# Patient Record
Sex: Male | Born: 1980 | Race: Black or African American | Hispanic: No | Marital: Single | Smoking: Never smoker
Health system: Southern US, Community
[De-identification: ages and names within clinical notes are randomized; demographics above are authoritative.]

## PROBLEM LIST (undated history)

## (undated) DIAGNOSIS — K219 Gastro-esophageal reflux disease without esophagitis: Secondary | ICD-10-CM

## (undated) HISTORY — DX: Gastro-esophageal reflux disease without esophagitis: K21.9

---

## 2004-01-14 ENCOUNTER — Emergency Department: Payer: Self-pay | Admitting: Emergency Medicine

## 2004-01-20 ENCOUNTER — Emergency Department: Payer: Self-pay | Admitting: Emergency Medicine

## 2004-05-06 ENCOUNTER — Emergency Department: Payer: Self-pay | Admitting: Unknown Physician Specialty

## 2004-05-12 ENCOUNTER — Emergency Department: Payer: Self-pay | Admitting: Unknown Physician Specialty

## 2004-05-14 ENCOUNTER — Emergency Department: Payer: Self-pay | Admitting: Emergency Medicine

## 2005-08-26 ENCOUNTER — Emergency Department: Payer: Self-pay | Admitting: Emergency Medicine

## 2005-09-09 ENCOUNTER — Emergency Department: Payer: Self-pay | Admitting: Emergency Medicine

## 2005-11-20 ENCOUNTER — Emergency Department: Payer: Self-pay | Admitting: Emergency Medicine

## 2006-04-02 ENCOUNTER — Emergency Department: Payer: Self-pay | Admitting: Emergency Medicine

## 2006-05-31 ENCOUNTER — Other Ambulatory Visit: Payer: Self-pay

## 2006-05-31 ENCOUNTER — Inpatient Hospital Stay: Payer: Self-pay | Admitting: Internal Medicine

## 2007-07-20 IMAGING — CR RIGHT TIBIA AND FIBULA - 2 VIEW
1 series · 2 of 2 positions shown · non-contrast
Comparison: none

REASON FOR EXAM: INJURY, PAIN
COMMENTS:

RESULT:     AP and lateral views of the RIGHT lower leg show no fracture,
dislocation or other acute bony abnormality.

[Series 1: view not recorded · 0.17mm/px · 2 of 2 slices shown]
[im 1/2]
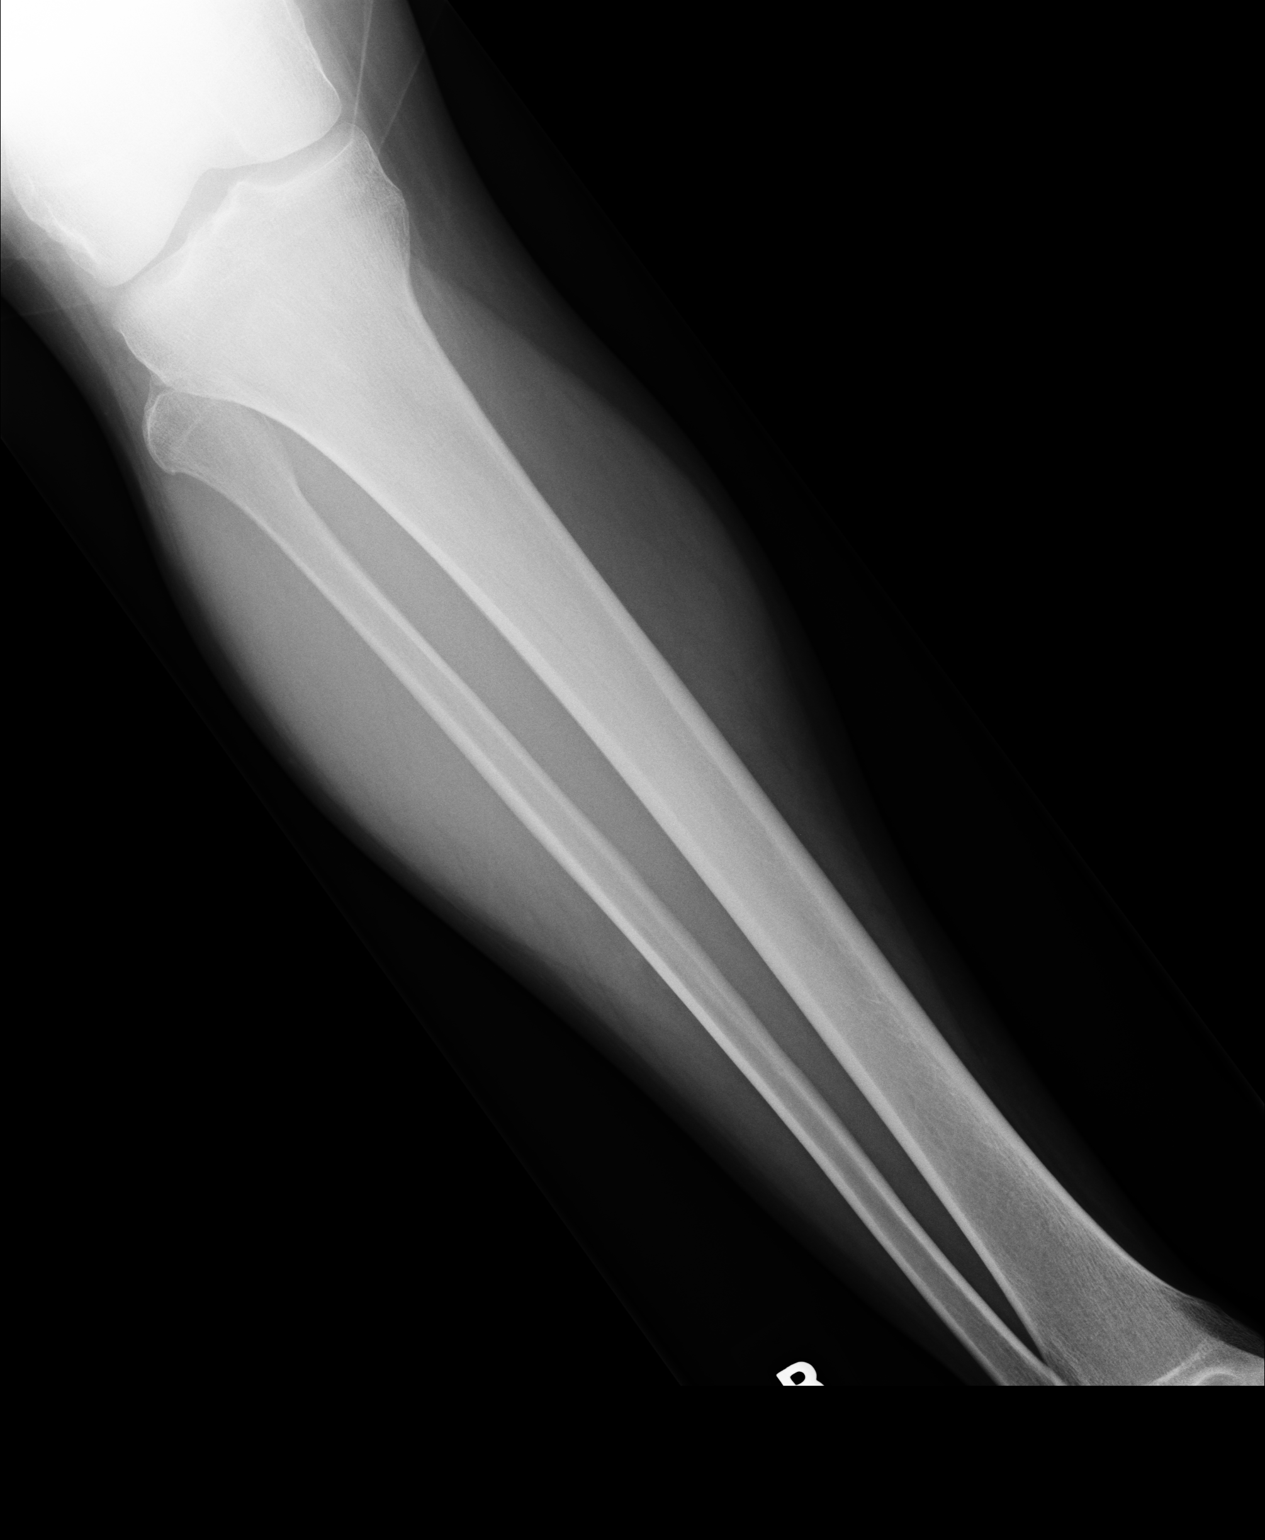
[im 2/2]
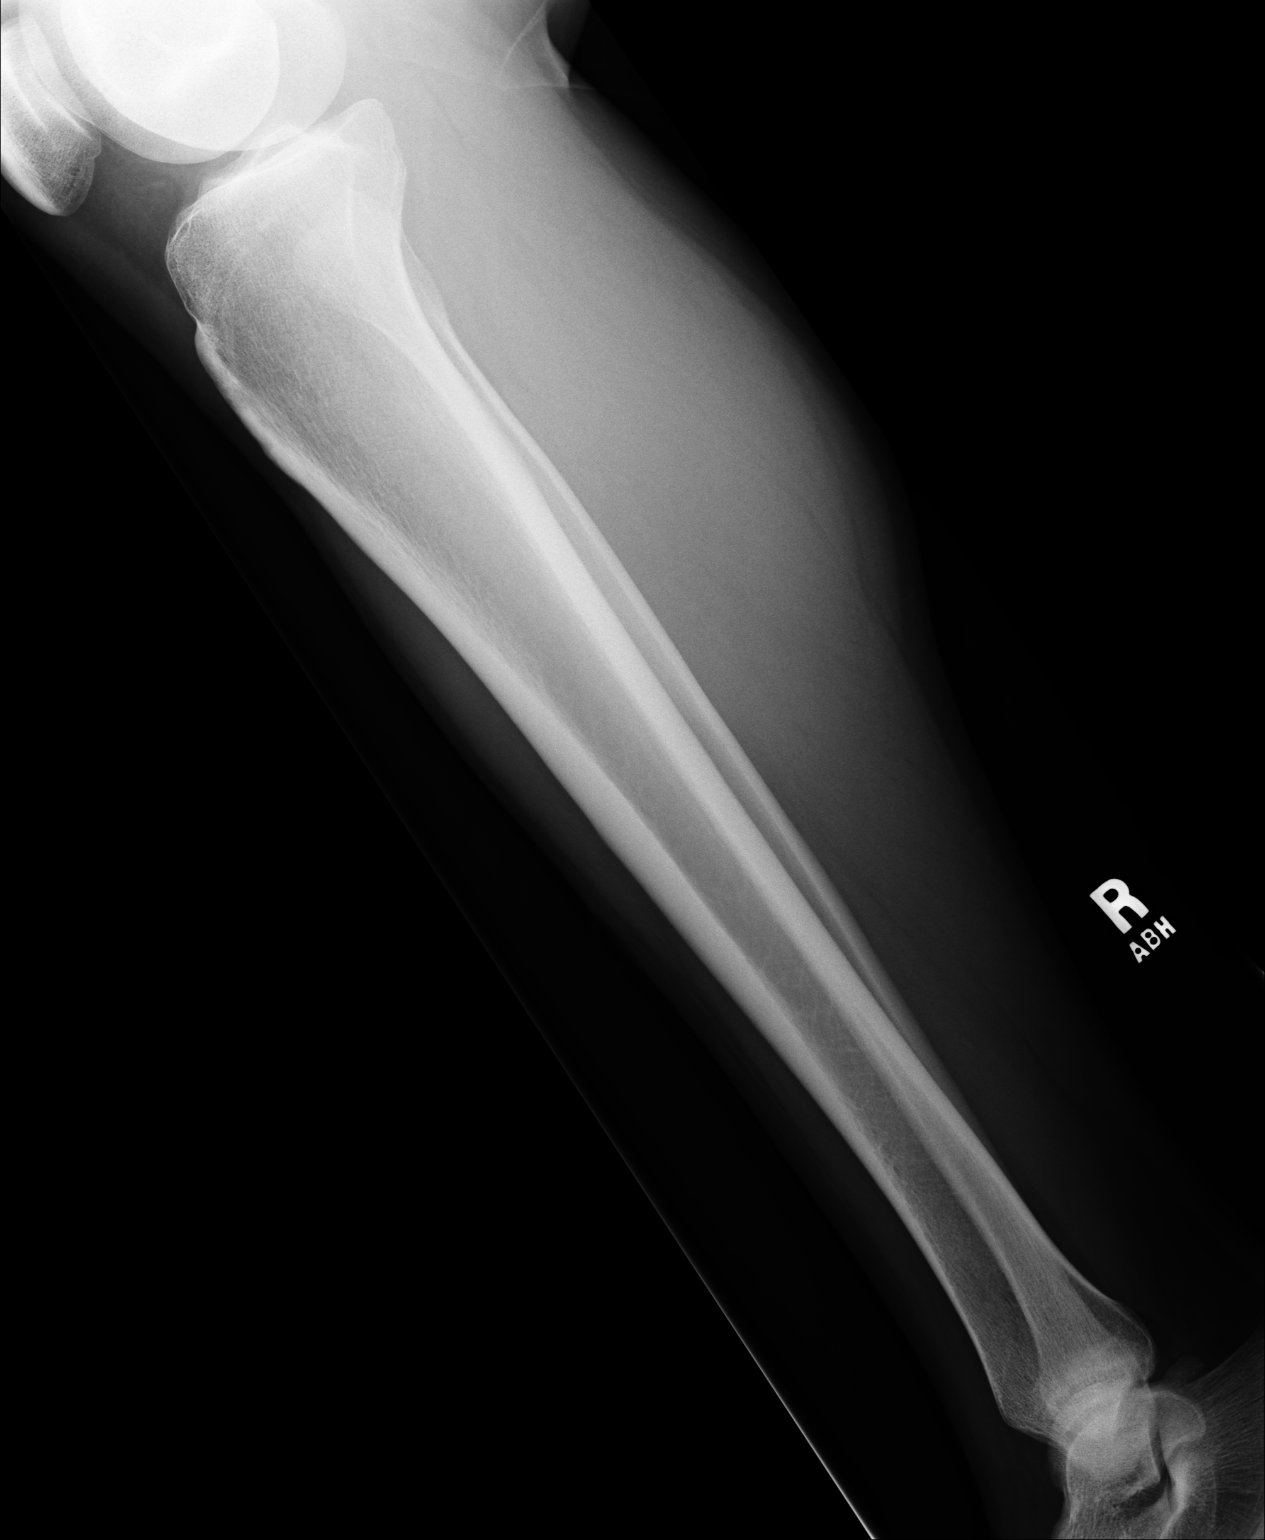

[2 of 2 positions shown; findings below may reference images not displayed]

IMPRESSION: No acute changes are identified.

## 2007-10-07 ENCOUNTER — Emergency Department: Payer: Self-pay | Admitting: Emergency Medicine

## 2007-12-02 ENCOUNTER — Emergency Department: Payer: Self-pay | Admitting: Emergency Medicine

## 2007-12-26 ENCOUNTER — Emergency Department (HOSPITAL_COMMUNITY): Admission: EM | Admit: 2007-12-26 | Discharge: 2007-12-26 | Payer: Self-pay | Admitting: Emergency Medicine

## 2009-08-31 IMAGING — CT CT HEAD WITHOUT CONTRAST
2 series · 15 of 30 positions shown, 19 images · non-contrast
Comparison: none

REASON FOR EXAM: headache
COMMENTS:

[Series 2: without · axial · non-contrast · 0.43mm/px · z∈[-144,-14]mm · 13 of 32 slices shown, 17 images]
[im 3/32  brain]
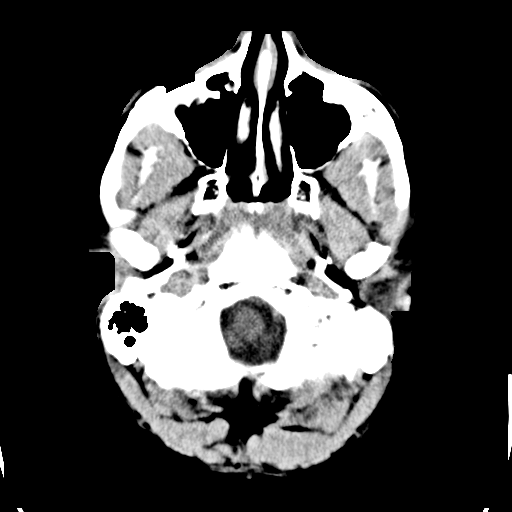
[im 3/32  bone]
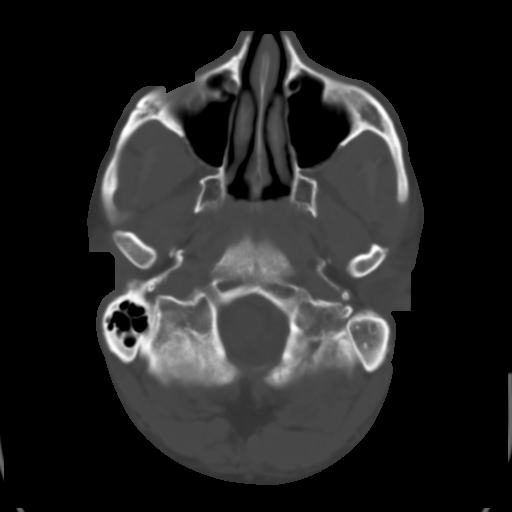
[im 5/32  brain]
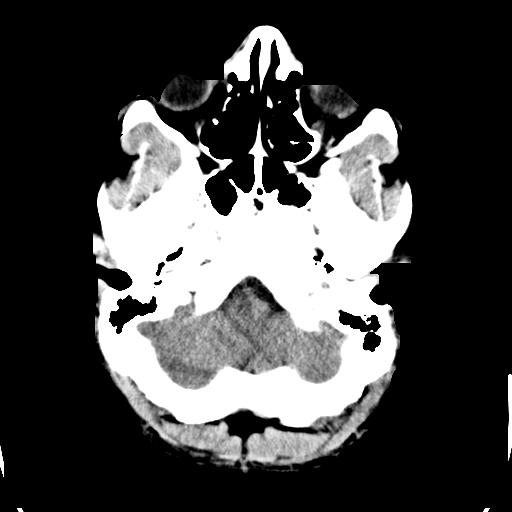
[im 7/32  brain]
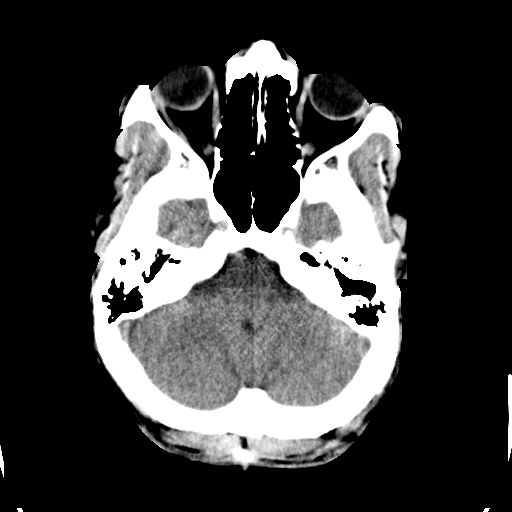
[im 9/32  brain]
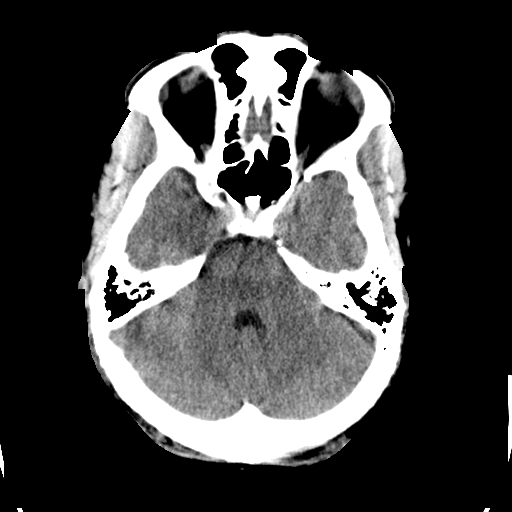
[im 12/32  brain]
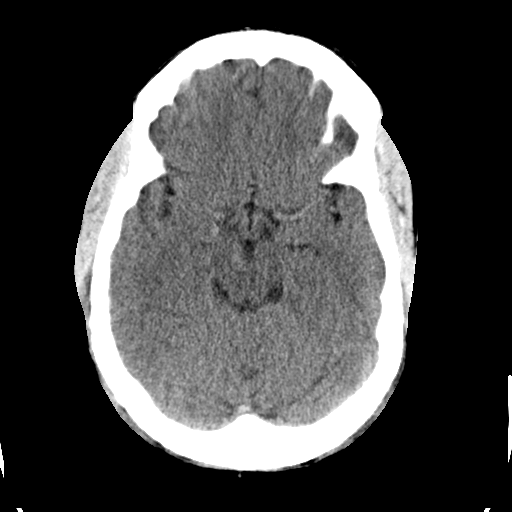
[im 12/32  bone]
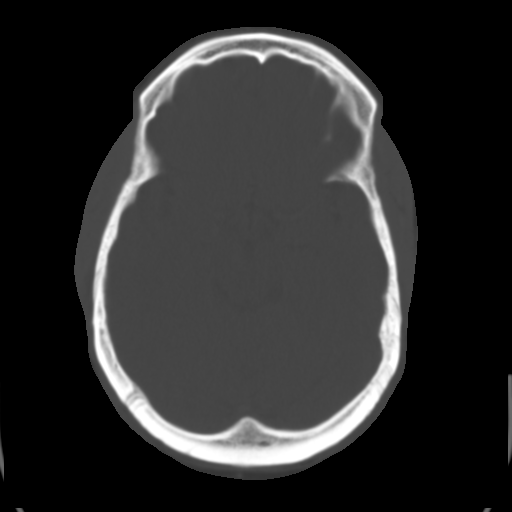
[im 14/32  brain]
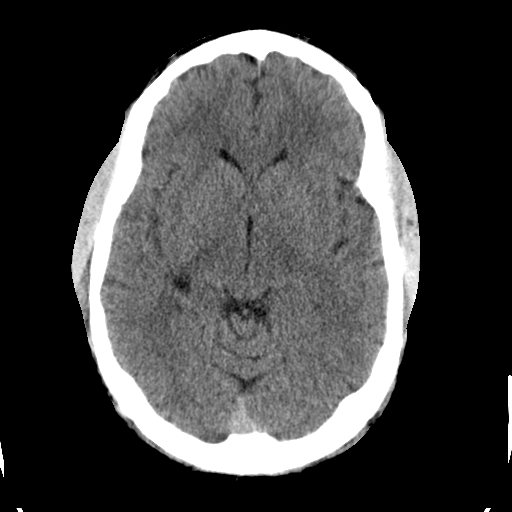
[im 16/32  brain]
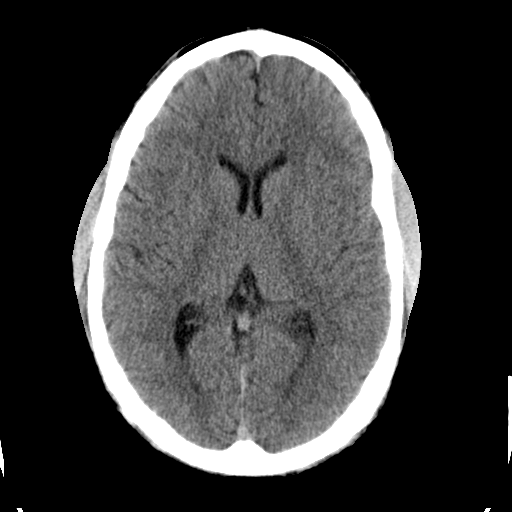
[im 18/32  brain]
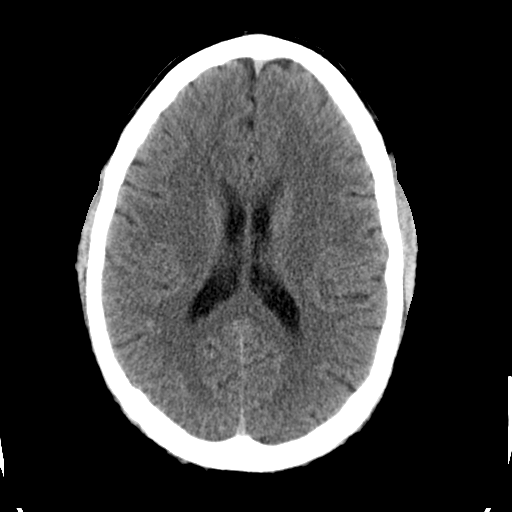
[im 20/32  brain]
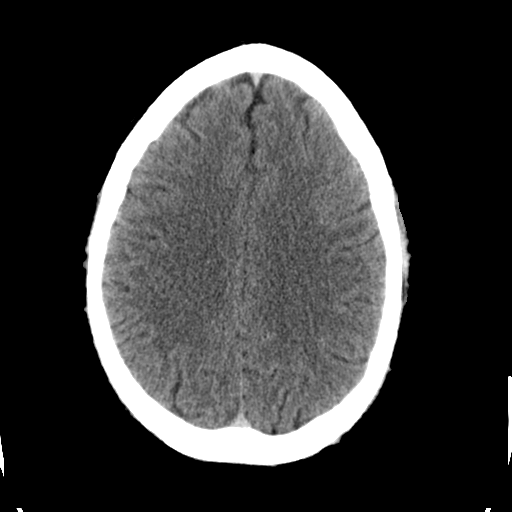
[im 20/32  bone]
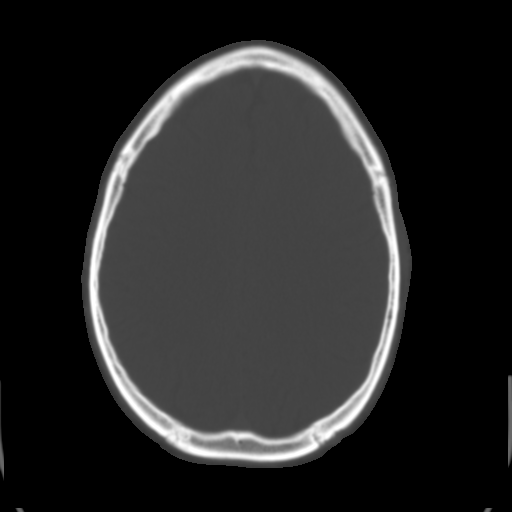
[im 23/32  brain]
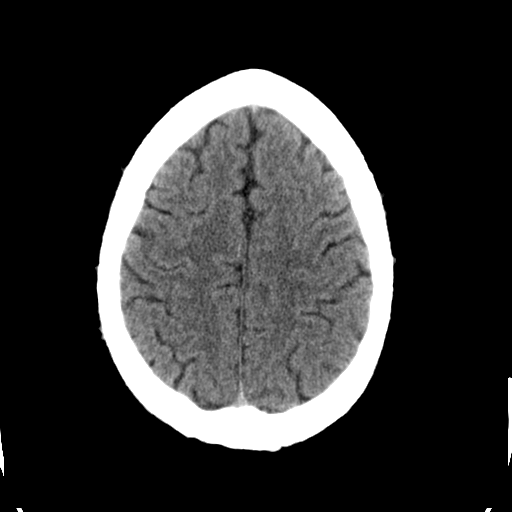
[im 25/32  brain]
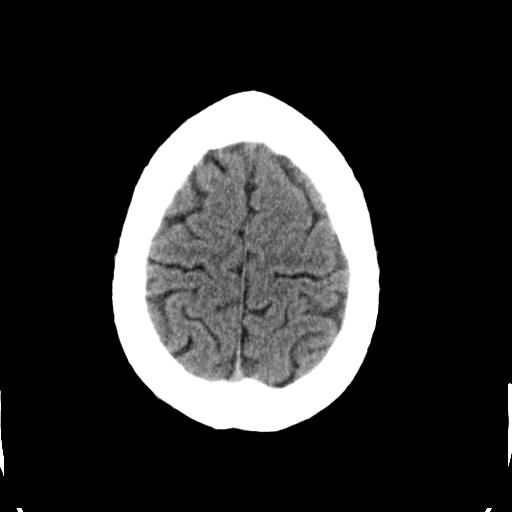
[im 27/32  brain]
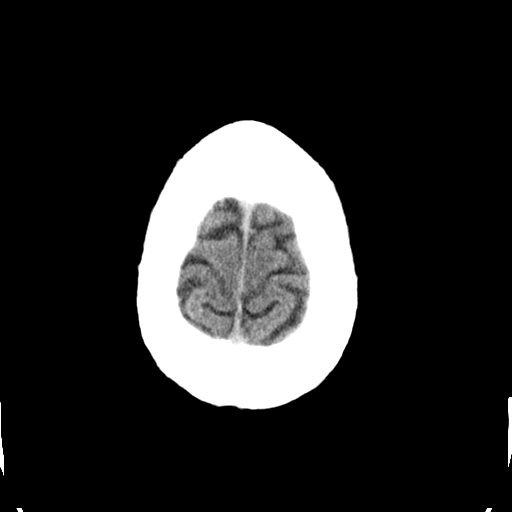
[im 29/32  brain]
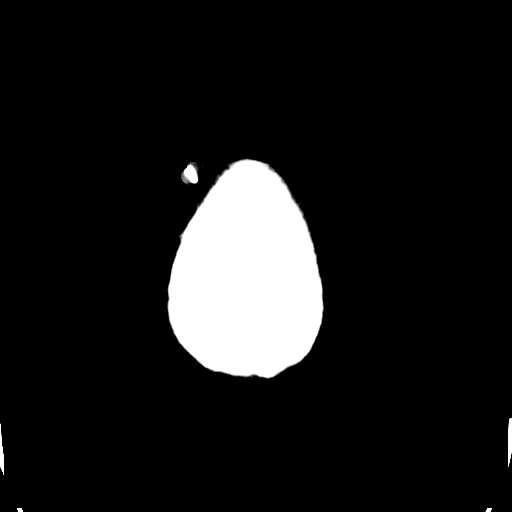
[im 29/32  bone]
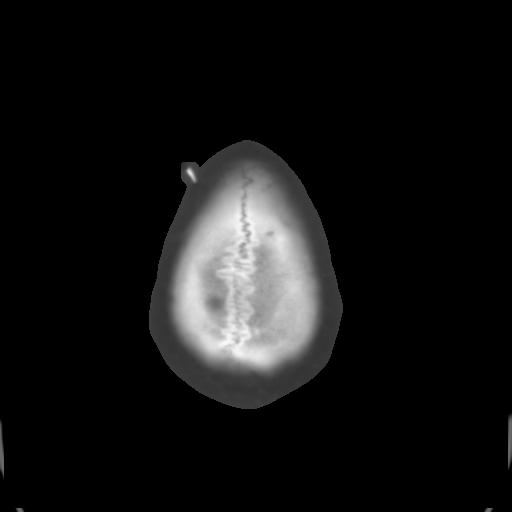

[Series 3: bone · axial · 0.43mm/px · z∈[-144,-124]mm · 2 of 32 slices shown]
[im 3/32  bone]
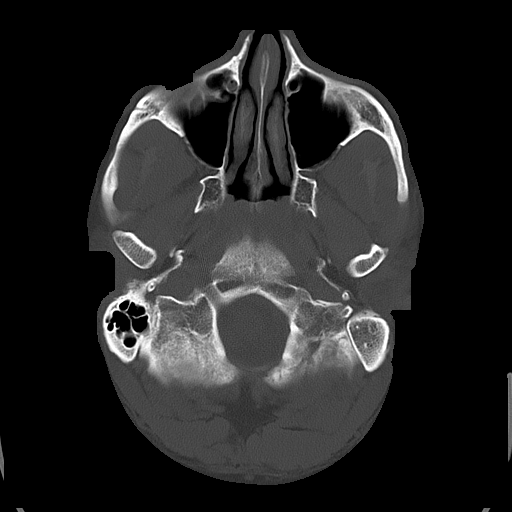
[im 7/32  bone]
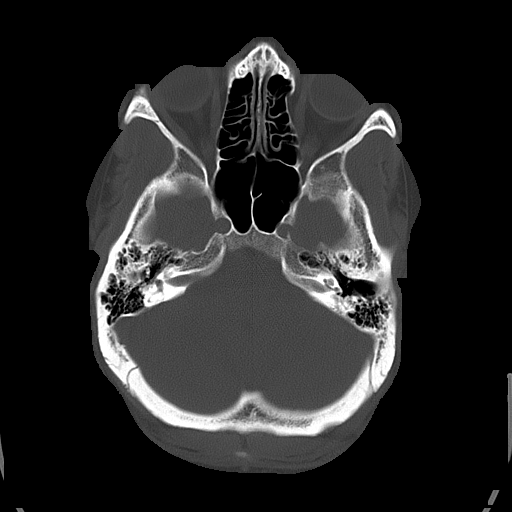

[15 of 30 positions shown; findings below may reference images not displayed]

PROCEDURE:     CT  - CT HEAD WITHOUT CONTRAST  - October 08, 2007  [DATE]

RESULT:     Comparison is made to a prior study dated 01/14/2004.

RESULT:      There is no evidence of intraaxial nor extraaxial fluid
collections nor evidence of acute hemorrhage.  No secondary signs are
appreciated to suggest mass effect, subacute or chronic infarction.  There
does not appear to be evidence of an acute fracture nor dislocation.  There
is no evidence of a depressed skull fracture, though a chronic fracture
involving the base of the nasal bone on the RIGHT is appreciated.
IMPRESSION: 1.     No evidence of acute intracranial abnormalities.
2.     Note, a metallic density projects within the soft tissues in the
frontal region on the RIGHT which appears stable when compared to the
previous study.

Dr. Mahfud, of the emergency department, was informed of these findings via
preliminary fax report on 10/08/2007 at [DATE]  a.m. Central Standard Time.

## 2010-07-26 ENCOUNTER — Emergency Department: Payer: Self-pay | Admitting: Unknown Physician Specialty

## 2011-04-23 ENCOUNTER — Emergency Department: Payer: Self-pay | Admitting: Emergency Medicine

## 2011-04-25 ENCOUNTER — Emergency Department: Payer: Self-pay | Admitting: Emergency Medicine

## 2011-10-05 ENCOUNTER — Emergency Department: Payer: Self-pay | Admitting: Emergency Medicine

## 2011-10-11 ENCOUNTER — Emergency Department: Payer: Self-pay | Admitting: Emergency Medicine

## 2012-06-26 ENCOUNTER — Emergency Department: Payer: Self-pay | Admitting: Unknown Physician Specialty

## 2012-07-15 ENCOUNTER — Emergency Department: Payer: Self-pay | Admitting: Internal Medicine

## 2012-09-04 ENCOUNTER — Emergency Department: Payer: Self-pay | Admitting: Emergency Medicine

## 2013-03-11 ENCOUNTER — Ambulatory Visit: Payer: Self-pay | Admitting: Unknown Physician Specialty

## 2016-02-28 DIAGNOSIS — E049 Nontoxic goiter, unspecified: Secondary | ICD-10-CM | POA: Insufficient documentation

## 2016-09-19 ENCOUNTER — Telehealth (HOSPITAL_BASED_OUTPATIENT_CLINIC_OR_DEPARTMENT_OTHER): Payer: Self-pay | Admitting: *Deleted

## 2016-09-19 ENCOUNTER — Emergency Department (HOSPITAL_COMMUNITY)
Admission: EM | Admit: 2016-09-19 | Discharge: 2016-09-19 | Disposition: A | Discharge status: Home / Self Care | Payer: BLUE CROSS/BLUE SHIELD | Attending: Emergency Medicine | Admitting: Emergency Medicine

## 2016-09-19 ENCOUNTER — Encounter (HOSPITAL_COMMUNITY): Payer: Self-pay | Admitting: Emergency Medicine

## 2016-09-19 DIAGNOSIS — R110 Nausea: Secondary | ICD-10-CM | POA: Insufficient documentation

## 2016-09-19 DIAGNOSIS — R197 Diarrhea, unspecified: Secondary | ICD-10-CM

## 2016-09-19 LAB — GASTROINTESTINAL PANEL BY PCR, STOOL (REPLACES STOOL CULTURE)
ADENOVIRUS F40/41: NOT DETECTED
Astrovirus: NOT DETECTED
CRYPTOSPORIDIUM: NOT DETECTED
CYCLOSPORA CAYETANENSIS: NOT DETECTED
Campylobacter species: NOT DETECTED
ENTAMOEBA HISTOLYTICA: NOT DETECTED
ENTEROAGGREGATIVE E COLI (EAEC): NOT DETECTED
ENTEROPATHOGENIC E COLI (EPEC): NOT DETECTED
Enterotoxigenic E coli (ETEC): NOT DETECTED
GIARDIA LAMBLIA: NOT DETECTED
Norovirus GI/GII: DETECTED — AB
Plesimonas shigelloides: NOT DETECTED
Rotavirus A: NOT DETECTED
SALMONELLA SPECIES: NOT DETECTED
SHIGELLA/ENTEROINVASIVE E COLI (EIEC): NOT DETECTED
Sapovirus (I, II, IV, and V): NOT DETECTED
Shiga like toxin producing E coli (STEC): NOT DETECTED
VIBRIO CHOLERAE: NOT DETECTED
VIBRIO SPECIES: NOT DETECTED
YERSINIA ENTEROCOLITICA: NOT DETECTED

## 2016-09-19 LAB — COMPREHENSIVE METABOLIC PANEL
ALT: 38 U/L (ref 17–63)
AST: 36 U/L (ref 15–41)
Albumin: 4.7 g/dL (ref 3.5–5.0)
Alkaline Phosphatase: 55 U/L (ref 38–126)
Anion gap: 6 (ref 5–15)
BUN: 13 mg/dL (ref 6–20)
CHLORIDE: 103 mmol/L (ref 101–111)
CO2: 27 mmol/L (ref 22–32)
CREATININE: 0.89 mg/dL (ref 0.61–1.24)
Calcium: 9.5 mg/dL (ref 8.9–10.3)
GFR calc non Af Amer: >60 mL/min (ref >60)
Glucose, Bld: 92 mg/dL (ref 65–99)
POTASSIUM: 4.5 mmol/L (ref 3.5–5.1)
SODIUM: 136 mmol/L (ref 135–145)
Total Bilirubin: 0.4 mg/dL (ref 0.3–1.2)
Total Protein: 8.7 g/dL — ABNORMAL HIGH (ref 6.5–8.1)

## 2016-09-19 LAB — CBC
HEMATOCRIT: 40.1 % (ref 39.0–52.0)
HEMOGLOBIN: 14.2 g/dL (ref 13.0–17.0)
MCH: 31 pg (ref 26.0–34.0)
MCHC: 35.4 g/dL (ref 30.0–36.0)
MCV: 87.6 fL (ref 78.0–100.0)
PLATELETS: 228 10*3/uL (ref 150–400)
RBC: 4.58 MIL/uL (ref 4.22–5.81)
RDW: 11.9 % (ref 11.5–15.5)
WBC: 4.2 10*3/uL (ref 4.0–10.5)

## 2016-09-19 LAB — LIPASE, BLOOD: LIPASE: 16 U/L (ref 11–51)

## 2016-09-19 LAB — POC OCCULT BLOOD, ED: Fecal Occult Bld: NEGATIVE

## 2016-09-19 MED ORDER — SODIUM CHLORIDE 0.9 % IV BOLUS (SEPSIS)
1000.0000 mL | Freq: Once | INTRAVENOUS | Status: AC
  Administered 2016-09-19: 1000 mL via INTRAVENOUS

## 2016-09-19 MED ORDER — ONDANSETRON HCL 4 MG/2ML IJ SOLN
4.0000 mg | Freq: Once | INTRAMUSCULAR | Status: AC
  Administered 2016-09-19: 4 mg via INTRAVENOUS
  Filled 2016-09-19: qty 2

## 2016-09-19 NOTE — Telephone Encounter (Signed)
Spoke with Dr Juleen ChinaKohut regarding + norovirus result no further treatment needed Did call pt with updated education regarding dx. LVMM with emergency contact

## 2016-09-19 NOTE — Discharge Instructions (Signed)
Try imodium.  Return for sudden abdominal pain, fever.

## 2016-09-19 NOTE — ED Provider Notes (Signed)
[facility]-EMERGENCY DEPT Provider Note   CSN: [id number] Arrival date & time: 09/19/16  1416     History   Chief Complaint Chief Complaint  Patient presents with  . Diarrhea    HPI Isaiah Perez is a 36 y.o. male.  36 yo M with a chief complaint of diarrhea. Going on for the past week. Patient is concerned because it has been somewhat dark today. He looked online and thought he might be bleeding. Denies suspicious food intake. Has some nausea but denies vomiting. Denies fevers. Denies sick contacts.   The history is provided by the patient.  Illness  This is a new problem. The current episode started more than 2 days ago. The problem occurs constantly. The problem has been gradually improving. Pertinent negatives include no chest pain, no abdominal pain, no headaches and no shortness of breath. Nothing aggravates the symptoms. Nothing relieves the symptoms. He has tried nothing for the symptoms. The treatment provided no relief.    History reviewed. No pertinent past medical history.  There are no active problems to display for this patient.   History reviewed. No pertinent surgical history.     Home Medications    Prior to Admission medications   Not on File    Family History No family history on file.  Social History Social History  Substance Use Topics  . Smoking status: Never Smoker  . Smokeless tobacco: Never Used  . Alcohol use Yes     Comment: social     Allergies   Patient has no known allergies.   Review of Systems Review of Systems  Constitutional: Negative for chills and fever.  HENT: Negative for congestion and facial swelling.   Eyes: Negative for discharge and visual disturbance.  Respiratory: Negative for shortness of breath.   Cardiovascular: Negative for chest pain and palpitations.  Gastrointestinal: Positive for diarrhea and nausea. Negative for abdominal pain and vomiting.  Musculoskeletal: Negative for arthralgias and myalgias.    Skin: Negative for color change and rash.  Neurological: Negative for tremors, syncope and headaches.  Psychiatric/Behavioral: Negative for confusion and dysphoric mood.     Physical Exam Updated Vital Signs BP 131/88 (BP Location: Left Arm)   Pulse 66   Temp 98.3 F (36.8 C) (Oral)   Resp 16   Ht 5\' 10"  (1.778 m)   Wt 104.3 kg (230 lb)   SpO2 96%   BMI 33.00 kg/m   Physical Exam  Constitutional: He is oriented to person, place, and time. He appears well-developed and well-nourished.  HENT:  Head: Normocephalic and atraumatic.  Eyes: Pupils are equal, round, and reactive to light. EOM are normal.  Neck: Normal range of motion. Neck supple. No JVD present.  Cardiovascular: Normal rate and regular rhythm.  Exam reveals no gallop and no friction rub.   No murmur heard. Pulmonary/Chest: No respiratory distress. He has no wheezes.  Abdominal: He exhibits no distension and no mass. There is no tenderness. There is no rebound and no guarding.  Musculoskeletal: Normal range of motion.  Neurological: He is alert and oriented to person, place, and time.  Skin: No rash noted. No pallor.  Psychiatric: He has a normal mood and affect. His behavior is normal.  Nursing note and vitals reviewed.    ED Treatments / Results  Labs (all labs ordered are listed, but only abnormal results are displayed) Labs Reviewed  COMPREHENSIVE METABOLIC PANEL - Abnormal; Notable for the following:       Result Value  Total Protein 8.7 (*)    All other components within normal limits  GASTROINTESTINAL PANEL BY PCR, STOOL (REPLACES STOOL CULTURE)  LIPASE, BLOOD  CBC  URINALYSIS, ROUTINE W REFLEX MICROSCOPIC  POC OCCULT BLOOD, ED    EKG  EKG Interpretation None       Radiology No results found.  Procedures Procedures (including critical care time)  Medications Ordered in ED Medications  sodium chloride 0.9 % bolus 1,000 mL (1,000 mLs Intravenous New Bag/Given 09/19/16 1507)   ondansetron (ZOFRAN) injection 4 mg (4 mg Intravenous Given 09/19/16 1507)     Initial Impression / Assessment and Plan / ED Course  I have reviewed the triage vital signs and the nursing notes.  Pertinent labs & imaging results that were available during my care of the patient were reviewed by me and considered in my medical decision making (see chart for details).     36 yo M With a chief complaint of nausea and diarrhea. Likely virus by history this been on for about 6 days. Stool sample sent. Occult blood negative. Lab work reassuring. Discharge home with PCP follow-up. Trial of Imodium  3:48 PM:  I have discussed the diagnosis/risks/treatment options with the patient and family and believe the pt to be eligible for discharge home to follow-up with PCP. We also discussed returning to the ED immediately if new or worsening sx occur. We discussed the sx which are most concerning (e.g., sudden worsening pain, fever, inability to tolerate by mouth) that necessitate immediate return. Medications administered to the patient during their visit and any new prescriptions provided to the patient are listed below.  Medications given during this visit Medications  sodium chloride 0.9 % bolus 1,000 mL (1,000 mLs Intravenous New Bag/Given 09/19/16 1507)  ondansetron (ZOFRAN) injection 4 mg (4 mg Intravenous Given 09/19/16 1507)     The patient appears reasonably screen and/or stabilized for discharge and I doubt any other medical condition or other [facility] requiring further screening, evaluation, or treatment in the ED at this time prior to discharge.    Final Clinical Impressions(s) / ED Diagnoses   Final diagnoses:  Diarrhea of presumed infectious origin    New Prescriptions New Prescriptions   No medications on file     Melene PlanFloyd, Kalen Ratajczak, DO 09/19/16 1548

## 2016-09-19 NOTE — ED Triage Notes (Signed)
Diarrhea since Sunday. Reports stools have been very dark today.

## 2017-02-04 ENCOUNTER — Other Ambulatory Visit: Payer: Self-pay | Admitting: Internal Medicine

## 2017-02-04 DIAGNOSIS — R1013 Epigastric pain: Secondary | ICD-10-CM

## 2017-02-10 ENCOUNTER — Ambulatory Visit: Payer: BLUE CROSS/BLUE SHIELD

## 2017-05-26 ENCOUNTER — Ambulatory Visit: Payer: Self-pay | Admitting: Urology

## 2017-06-03 ENCOUNTER — Encounter: Payer: Self-pay | Admitting: Urology

## 2017-06-03 ENCOUNTER — Ambulatory Visit: Payer: BLUE CROSS/BLUE SHIELD | Admitting: Urology

## 2017-06-03 VITALS — BP 126/78 | HR 55 | Resp 16 | Ht 70.0 in | Wt 234.1 lb

## 2017-06-03 DIAGNOSIS — N50819 Testicular pain, unspecified: Secondary | ICD-10-CM | POA: Diagnosis not present

## 2017-06-03 LAB — URINALYSIS, COMPLETE
Bilirubin, UA: NEGATIVE
GLUCOSE, UA: NEGATIVE
KETONES UA: NEGATIVE
LEUKOCYTES UA: NEGATIVE
Nitrite, UA: NEGATIVE
PROTEIN UA: NEGATIVE
RBC, UA: NEGATIVE
Specific Gravity, UA: >1.03 — ABNORMAL HIGH (ref 1.005–1.030)
UUROB: 0.2 mg/dL (ref 0.2–1.0)
pH, UA: 5 (ref 5.0–7.5)

## 2017-06-03 NOTE — Progress Notes (Signed)
06/03/2017 1:38 PM   Isaiah Perez 1980-09-12 [id number]  Referring provider: Lauro Regulus, MD [address] [facility] [place], [place] [address]  Chief complaint: Testicle pain  HPI: Isaiah Perez is a 37 year old male who states over 10 years ago he began to have intermittent episodes of right hemiscrotal pain and twisting of his right testis.  He will have to manually untwist to resolve the pain.  He states this will occasionally happen on the contralateral side.  The pain can be severe at time but resolves with untwisting.  He states he thought this was normal.  He also has intermittent episodes of scrotal swelling and tenderness to the touch which is not associated with the above episodes.  The pain and swelling will occur with minimal trauma.  He has no voiding complaints.  He denies dysuria or gross hematuria.   PMH: Past Medical History:  Diagnosis Date  . GERD (gastroesophageal reflux disease)     Surgical History: History reviewed. No pertinent surgical history.  Home Medications:  Allergies as of 06/03/2017   No Known Allergies     Medication List    as of 06/03/2017  1:38 PM   You have not been prescribed any medications.     Allergies: No Known Allergies  Family History: Family History  Problem Relation Age of Onset  . Prostate cancer Neg Hx   . Bladder Cancer Neg Hx   . Kidney cancer Neg Hx     Social History:  reports that he has never smoked. He has never used smokeless tobacco. He reports that he drinks alcohol. He reports that he does not use drugs.  ROS: UROLOGY Frequent Urination?: No Hard to postpone urination?: No Burning/pain with urination?: No Get up at night to urinate?: No Leakage of urine?: No Urine stream starts and stops?: Yes Trouble starting stream?: No Do you have to strain to urinate?: No Blood in urine?: No Urinary tract infection?: No Sexually transmitted disease?: No Injury to  kidneys or bladder?: No Painful intercourse?: No Weak stream?: Yes Erection problems?: No Penile pain?: Yes  Gastrointestinal Nausea?: No Vomiting?: No Indigestion/heartburn?: Yes Diarrhea?: No Constipation?: No  Constitutional Fever: No Night sweats?: No Weight loss?: No Fatigue?: No  Skin Skin rash/lesions?: No Itching?: No  Eyes Blurred vision?: No Double vision?: No  Ears/Nose/Throat Sore throat?: No Sinus problems?: No  Hematologic/Lymphatic Swollen glands?: No Easy bruising?: No  Cardiovascular Leg swelling?: No Chest pain?: No  Respiratory Cough?: No Shortness of breath?: No  Endocrine Excessive thirst?: No  Musculoskeletal Back pain?: Yes Joint pain?: Yes  Neurological Headaches?: No Dizziness?: No  Psychologic Depression?: No Anxiety?: No  Physical Exam: BP 126/78   Pulse (!) 55   Resp 16   Ht 5\' 10"  (1.778 m)   Wt 234 lb 1.6 oz (106.2 kg)   SpO2 98%   BMI 33.59 kg/m   Constitutional:  Alert and oriented, No acute distress. HEENT: [place] AT, moist mucus membranes.  Trachea midline, no masses. Cardiovascular: No clubbing, cyanosis, or edema. Respiratory: Normal respiratory effort, no increased work of breathing. GI: Abdomen is soft, nontender, nondistended, no abdominal masses GU: No CVA tenderness Lymph: No cervical or inguinal lymphadenopathy. Skin: No rashes, bruises or suspicious lesions. Neurologic: Grossly intact, no focal deficits, moving all 4 extremities. Psychiatric: Normal mood and affect.  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  I did discuss elective orchiopexy including  potential risks of bleeding, infection and chronic pain.  He was informed ideally it would be better to be evaluated while having pain to document and to obtain a Doppler ultrasound while having pain.  It was recommended he proceed to the ED for further evaluation with his next episode.  He was informed that if he ever has an episode of pain lasting more than 1 hour to proceed directly to the emergency department as he could require emergent surgery or he could have ischemic loss of the testis.  As far as his episodes of scrotal swelling and tenderness I recommended reassessment while he is symptomatic in the office.    Riki AltesScott C Stoioff, MD  [facility] [address], [address] [place], [place] [address] [phone]

## 2017-07-15 ENCOUNTER — Ambulatory Visit: Payer: BLUE CROSS/BLUE SHIELD | Admitting: Urology

## 2017-07-15 ENCOUNTER — Encounter: Payer: Self-pay | Admitting: Urology

## 2017-07-15 VITALS — BP 135/77 | HR 66 | Ht 70.0 in | Wt 244.3 lb

## 2017-07-15 DIAGNOSIS — N50819 Testicular pain, unspecified: Secondary | ICD-10-CM

## 2017-07-15 LAB — URINALYSIS, COMPLETE
Bilirubin, UA: NEGATIVE
GLUCOSE, UA: NEGATIVE
Leukocytes, UA: NEGATIVE
Nitrite, UA: NEGATIVE
PROTEIN UA: NEGATIVE
RBC, UA: NEGATIVE
Specific Gravity, UA: >1.03 — ABNORMAL HIGH (ref 1.005–1.030)
UUROB: 0.2 mg/dL (ref 0.2–1.0)
pH, UA: 5 (ref 5.0–7.5)

## 2017-07-15 LAB — MICROSCOPIC EXAMINATION
BACTERIA UA: NONE SEEN
RBC MICROSCOPIC, UA: NONE SEEN /HPF (ref 0–2)

## 2017-07-15 NOTE — Progress Notes (Signed)
07/15/2017 10:10 AM   Isaiah Perez [birth year] [id number]  Referring provider: Lauro Regulus, MD [address] [facility] [place], [place] [address]  Chief Complaint  Patient presents with  . Testicle Pain    HPI: 37 year old male seen on 06/03/2017 with a greater than 10-year history of intermittent right hemiscrotal pain which she described as "twisting of his right testis" which resolves with manual "untwisting".  He was also having intermittent episodes of scrotal swelling and tenderness.  Since his last visit he denies any episodes of scrotal swelling however states he has had 3-4 episodes of similar right testis pain.  He also occasionally notes a clearish urethral discharge present in the morning.  He has no bothersome lower urinary tract symptoms or pain.   PMH: Past Medical History:  Diagnosis Date  . GERD (gastroesophageal reflux disease)     Surgical History: History reviewed. No pertinent surgical history.  Home Medications:  Allergies as of 07/15/2017   No Known Allergies     Medication List    as of 07/15/2017 10:10 AM   You have not been prescribed any medications.     Allergies: No Known Allergies  Family History: Family History  Problem Relation Age of Onset  . Prostate cancer Neg Hx   . Bladder Cancer Neg Hx   . Kidney cancer Neg Hx     Social History:  reports that he has never smoked. He has never used smokeless tobacco. He reports that he drinks alcohol. He reports that he does not use drugs.  ROS: UROLOGY Frequent Urination?: No Hard to postpone urination?: Yes Burning/pain with urination?: No Get up at night to urinate?: No Leakage of urine?: No Urine stream starts and stops?: No Trouble starting stream?: No Do you have to strain to urinate?: No Blood in urine?: No Urinary tract infection?: No Sexually transmitted disease?: No Injury to kidneys or bladder?: No Painful intercourse?: No Weak  stream?: Yes Erection problems?: No Penile pain?: No  Gastrointestinal Nausea?: No Vomiting?: No Indigestion/heartburn?: No Diarrhea?: No Constipation?: No  Constitutional Fever: No Night sweats?: No Weight loss?: No Fatigue?: No  Skin Skin rash/lesions?: No Itching?: No  Eyes Blurred vision?: No Double vision?: No  Ears/Nose/Throat Sore throat?: No Sinus problems?: No  Hematologic/Lymphatic Swollen glands?: No Easy bruising?: No  Cardiovascular Leg swelling?: No Chest pain?: No  Respiratory Cough?: No Shortness of breath?: No  Endocrine Excessive thirst?: No  Musculoskeletal Back pain?: No Joint pain?: No  Neurological Headaches?: No Dizziness?: No  Psychologic Depression?: No Anxiety?: No  Physical Exam: BP 135/77   Pulse 66   Ht  (1.778 m)   Wt 244 lb 4.8 oz (110.8 kg)   BMI 35.05 kg/m   Constitutional:  Alert and oriented, No acute distress. HEENT: [place] AT, moist mucus membranes.  Trachea midline, no masses. Cardiovascular: No clubbing, cyanosis, or edema. Respiratory: Normal respiratory effort, no increased work of breathing. GI: Abdomen is soft, nontender, nondistended, no abdominal masses GU: No CVA tenderness Lymph: No cervical or inguinal lymphadenopathy. Skin: No rashes, bruises or suspicious lesions. Neurologic: Grossly intact, no focal deficits, moving all 4 extremities. Psychiatric: Normal mood and affect.   Assessment & Plan:   I again discussed with Mr. 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  I discussed scheduling an elective bilateral orchiopexy.  The procedure was discussed and he was informed that since a definite partial portion has not been documented that this procedure  would be based on history.  The possibility of chronic scrotal pain and persistent pain was discussed.  The option of continued monitoring as long as he is always close to an ED should he develop severe pain.  I again  discussed it would be ideal to have him evaluated while he is having pain for documentation.  He was informed his urethral discharge is most likely secondary to normal prostatic secretions and a urinalysis was ordered.   Riki Altes, MD  [facility] [address] [place], [place] [address] [phone]

## 2017-07-16 ENCOUNTER — Telehealth: Payer: Self-pay

## 2017-07-16 NOTE — Telephone Encounter (Signed)
-----   Message from Riki Altes, MD sent at 07/16/2017  7:29 AM EDT ----- Urinalysis was normal

## 2017-07-16 NOTE — Telephone Encounter (Signed)
Attempted to reach pt at [phone], number was not working, will try again.

## 2017-07-22 NOTE — Telephone Encounter (Signed)
Pt informed

## 2019-02-10 DIAGNOSIS — R030 Elevated blood-pressure reading, without diagnosis of hypertension: Secondary | ICD-10-CM | POA: Insufficient documentation

## 2019-02-10 DIAGNOSIS — F411 Generalized anxiety disorder: Secondary | ICD-10-CM | POA: Insufficient documentation

## 2019-05-17 ENCOUNTER — Other Ambulatory Visit
Admission: RE | Admit: 2019-05-17 | Discharge: 2019-05-17 | Disposition: A | Discharge status: Home / Self Care | Payer: BLUE CROSS/BLUE SHIELD | Source: Ambulatory Visit | Attending: Internal Medicine | Admitting: Internal Medicine

## 2019-05-17 DIAGNOSIS — R079 Chest pain, unspecified: Secondary | ICD-10-CM | POA: Insufficient documentation

## 2019-05-17 LAB — FIBRIN DERIVATIVES D-DIMER (ARMC ONLY): Fibrin derivatives D-dimer (ARMC): 288.94 ng/mL (FEU) (ref 0.00–499.00)

## 2019-05-17 LAB — TROPONIN I (HIGH SENSITIVITY): Troponin I (High Sensitivity): 8 ng/L (ref <18)

## 2021-12-05 ENCOUNTER — Ambulatory Visit
Admission: RE | Admit: 2021-12-05 | Discharge: 2021-12-05 | Disposition: A | Discharge status: Home / Self Care | Payer: No Typology Code available for payment source | Source: Ambulatory Visit | Attending: Chiropractor | Admitting: Chiropractor

## 2021-12-05 ENCOUNTER — Ambulatory Visit
Admission: RE | Admit: 2021-12-05 | Discharge: 2021-12-05 | Disposition: A | Discharge status: Home / Self Care | Payer: No Typology Code available for payment source | Source: Ambulatory Visit | Attending: Hematology and Oncology | Admitting: Hematology and Oncology

## 2021-12-05 ENCOUNTER — Other Ambulatory Visit: Payer: Self-pay

## 2021-12-05 ENCOUNTER — Other Ambulatory Visit: Payer: Self-pay | Admitting: Chiropractor

## 2021-12-05 DIAGNOSIS — S161XXA Strain of muscle, fascia and tendon at neck level, initial encounter: Secondary | ICD-10-CM

## 2021-12-05 DIAGNOSIS — S39012A Strain of muscle, fascia and tendon of lower back, initial encounter: Secondary | ICD-10-CM

## 2021-12-05 DIAGNOSIS — S239XXA Sprain of unspecified parts of thorax, initial encounter: Secondary | ICD-10-CM | POA: Diagnosis present

## 2022-02-23 ENCOUNTER — Emergency Department: Payer: BLUE CROSS/BLUE SHIELD

## 2022-02-23 ENCOUNTER — Other Ambulatory Visit: Payer: Self-pay

## 2022-02-23 DIAGNOSIS — S6991XA Unspecified injury of right wrist, hand and finger(s), initial encounter: Secondary | ICD-10-CM | POA: Diagnosis present

## 2022-02-23 DIAGNOSIS — S61411A Laceration without foreign body of right hand, initial encounter: Secondary | ICD-10-CM | POA: Diagnosis not present

## 2022-02-23 DIAGNOSIS — W25XXXA Contact with sharp glass, initial encounter: Secondary | ICD-10-CM | POA: Insufficient documentation

## 2022-02-23 NOTE — ED Triage Notes (Addendum)
Pt arrives via POV with CC of multiple lacerations to hand after punching glass to get into house. Pt reports did not have keys and could not find "brick" to use to break through glass. Pulses, perfusion, sensation, and range of motion intact at this time with bleeding controlled. Admits to alcohol use. Lacerations flushed with saline and hand dressed with wet saline dressing at this time.

## 2022-02-24 ENCOUNTER — Emergency Department
Admission: EM | Admit: 2022-02-24 | Discharge: 2022-02-24 | Disposition: A | Discharge status: Home / Self Care | Payer: BLUE CROSS/BLUE SHIELD | Attending: Emergency Medicine | Admitting: Emergency Medicine

## 2022-02-24 DIAGNOSIS — S61411A Laceration without foreign body of right hand, initial encounter: Secondary | ICD-10-CM

## 2022-02-24 MED ORDER — LIDOCAINE HCL (PF) 1 % IJ SOLN
INTRAMUSCULAR | Status: AC
  Filled 2022-02-24: qty 10

## 2022-02-24 MED ORDER — BACITRACIN ZINC 500 UNIT/GM EX OINT
TOPICAL_OINTMENT | CUTANEOUS | Status: AC
  Filled 2022-02-24: qty 0.9

## 2022-02-24 NOTE — ED Provider Notes (Signed)
[facility] Provider Note    Event Date/Time   First MD Initiated Contact with Patient 02/24/22 0037     (approximate)   History   Extremity Laceration   HPI  ESEQUIEL Perez is a 42 y.o. male right-hand-dominant with history of GERD who presents to the emergency department with multiple hand lacerations.  States that he had to punch through a glass window to get into his house tonight.  States his tetanus vaccine is up-to-date.  Denies any other injuries.  Able to move all of his fingers normally.  No numbness.   History provided by patient.    Past Medical History:  Diagnosis Date   GERD (gastroesophageal reflux disease)     History reviewed. No pertinent surgical history.  MEDICATIONS:  Prior to Admission medications   Not on File    Physical Exam   Triage Vital Signs: ED Triage Vitals  Enc Vitals Group     BP 02/23/22 2205 139/83     Pulse Rate 02/23/22 2205 75     Resp 02/23/22 2205 19     Temp 02/23/22 2205 98.1 F (36.7 C)     Temp Source 02/23/22 2205 Oral     SpO2 02/23/22 2205 94 %     Weight 02/23/22 2206 240 lb (108.9 kg)     Height 02/23/22 2206 5\' 10"  (1.778 m)     Head Circumference --      Peak Flow --      Pain Score 02/23/22 2206 0     Pain Loc --      Pain Edu? --      Excl. in GC? --     Most recent vital signs: Vitals:   02/23/22 2205 02/24/22 0201  BP: 139/83 (!) 139/91  Pulse: 75 65  Resp: 19 15  Temp: 98.1 F (36.7 C) 98.1 F (36.7 C)  SpO2: 94% 94%     CONSTITUTIONAL: Alert and responds appropriately to questions. Well-appearing; well-nourished HEAD: Normocephalic, atraumatic EYES: Conjunctivae clear, pupils appear equal ENT: normal nose; moist mucous membranes NECK: Normal range of motion CARD: Regular rate and rhythm RESP: Normal chest excursion without splinting or tachypnea; no hypoxia or respiratory distress, speaking full sentences ABD/GI: non-distended EXT: Normal ROM in all  joints, no major deformities noted, 3 lacerations noted to the dorsal right hand each approximately 2 cm in length, able to move all fingers without difficulty.  No bony deformity.  Normal cap refill.  2+ right radial pulse. SKIN: Normal color for age and race, no rashes on exposed skin NEURO: Moves all extremities equally, normal speech, no facial asymmetry noted PSYCH: The patient's mood and manner are appropriate. Grooming and personal hygiene are appropriate.  ED Results / Procedures / Treatments   LABS: (all labs ordered are listed, but only abnormal results are displayed) Labs Reviewed - No data to display   EKG:   RADIOLOGY: My personal review and interpretation of imaging: No fracture or retained foreign body.  I have personally reviewed all radiology reports. DG Hand Complete Right  Result Date: 02/23/2022 CLINICAL DATA:  Possible foreign body, glass. EXAM: RIGHT HAND - COMPLETE 3+ VIEW COMPARISON:  None Available. FINDINGS: There is no evidence of fracture or dislocation. There is no evidence of arthropathy or other focal bone abnormality. Soft tissues are unremarkable. IMPRESSION: Negative. Electronically Signed   By: 04/24/2022 M.D.   On: 02/23/2022 22:46     PROCEDURES:  Critical Care performed: No  LACERATION REPAIR x 3 Performed by: Pryor Curia Authorized by: Pryor Curia Consent: Verbal consent obtained. Risks and benefits: risks, benefits and alternatives were discussed Consent given by: patient Patient identity confirmed: provided demographic data Prepped and Draped in normal sterile fashion Wound explored  Laceration Location: right hand  Laceration Length: 2cm, 2cm, 2cm  No Foreign Bodies seen or palpated  Anesthesia: local infiltration  Local anesthetic: lidocaine 1% without epinephrine  Anesthetic total: 7 ml  Irrigation method: syringe Amount of cleaning: standard  Skin closure: Superficial  Number of sutures: 3, 3, 2  Technique:  Area anesthetized using lidocaine 1% without epinephrine. Wound irrigated copiously with sterile saline. Wound then cleaned with Betadine and draped in sterile fashion. Wound closed using a total of 8 simple interrupted sutures with 5-0 Vicryl.  Bacitracin and sterile dressing applied. Good wound approximation and hemostasis achieved.    Patient tolerance: Patient tolerated the procedure well with no immediate complications.    Procedures    IMPRESSION / MDM / ASSESSMENT AND PLAN / ED COURSE  I reviewed the triage vital signs and the nursing notes.   Patient here with multiple hand lacerations after punching a glass window.    DIFFERENTIAL DIAGNOSIS (includes but not limited to):   Lacerations, contusion, fracture, retained foreign body  Patient's presentation is most consistent with acute complicated illness / injury requiring diagnostic workup.  PLAN: X-ray reviewed and interpreted by myself and radiologist and shows no fracture or retained foreign body.  Will repair lacerations.  Tetanus vaccine up-to-date.  Neurovascular intact distally.   MEDICATIONS GIVEN IN ED: Medications  lidocaine (PF) (XYLOCAINE) 1 % injection (  Given 02/24/22 0200)  bacitracin 500 UNIT/GM ointment (  Given 02/24/22 0201)     ED COURSE: Lacerations repaired without difficulty.  Discussed wound care instructions and return precautions.  Recommended Tylenol, Motrin as needed for pain control.  No other injuries on exam.  At this time, I do not feel there is any life-threatening condition present. I reviewed all nursing notes, vitals, pertinent previous records.  All lab and urine results, EKGs, imaging ordered have been independently reviewed and interpreted by myself.  I reviewed all available radiology reports from any imaging ordered this visit.  Based on my assessment, I feel the patient is safe to be discharged home without further emergent workup and can continue workup as an outpatient as needed.  Discussed all findings, treatment plan as well as usual and customary return precautions.  They verbalize understanding and are comfortable with this plan.  Outpatient follow-up has been provided as needed.  All questions have been answered.    CONSULTS:  none   OUTSIDE RECORDS REVIEWED: Reviewed patient's last office visit with internal medicine on 02/22/2022     FINAL CLINICAL IMPRESSION(S) / ED DIAGNOSES   Final diagnoses:  Laceration of right hand without foreign body, initial encounter     Rx / DC Orders   ED Discharge Orders     None        Note:  This document was prepared using Dragon voice recognition software and may include unintentional dictation errors.   Kahli Mayon, Delice Bison, DO 02/24/22 [phone]

## 2022-02-24 NOTE — Discharge Instructions (Addendum)
You may alternate Tylenol 1000 mg every 6 hours as needed for pain, fever and Ibuprofen 800 mg every 6-8 hours as needed for pain, fever.  Please take Ibuprofen with food.  Do not take more than 4000 mg of Tylenol (acetaminophen) in a 24 hour period.   You may clean your wound with warm soap and water 1-2 times a day and apply over-the-counter Neosporin as needed.  Your sutures are absorbable and will come out on their own over time.  You do not need to have your sutures removed.  Your x-ray showed no fractures or retained glass in your wounds.

## 2022-03-25 DIAGNOSIS — Z87828 Personal history of other (healed) physical injury and trauma: Secondary | ICD-10-CM | POA: Insufficient documentation

## 2022-03-25 DIAGNOSIS — F410 Panic disorder [episodic paroxysmal anxiety] without agoraphobia: Secondary | ICD-10-CM | POA: Insufficient documentation

## 2024-03-13 ENCOUNTER — Encounter: Payer: Self-pay | Admitting: Emergency Medicine

## 2024-03-13 ENCOUNTER — Ambulatory Visit: Admission: EM | Admit: 2024-03-13 | Discharge: 2024-03-13 | Disposition: A | Discharge status: Home / Self Care

## 2024-03-13 DIAGNOSIS — R6889 Other general symptoms and signs: Secondary | ICD-10-CM | POA: Diagnosis not present

## 2024-03-13 DIAGNOSIS — R0982 Postnasal drip: Secondary | ICD-10-CM

## 2024-03-13 LAB — POCT RAPID STREP A (OFFICE): Rapid Strep A Screen: NEGATIVE

## 2024-03-13 MED ORDER — PREDNISONE 10 MG (21) PO TBPK: ORAL_TABLET | Freq: Every day | ORAL | 0 refills | Status: AC

## 2024-03-13 MED ORDER — CETIRIZINE HCL 10 MG PO TABS: 10.0000 mg | ORAL_TABLET | Freq: Every day | ORAL | 0 refills | Status: AC

## 2024-03-13 NOTE — ED Triage Notes (Signed)
 Pt presents c/o sore throat  x today. Pt states,  I just woke up and couldn't breathe. I looked in my throat and it's really swollen.  Pt denies any additional sxs.

## 2024-03-13 NOTE — ED Provider Notes (Signed)
 " [facility]    CSN: [id number] Arrival date & time: 03/13/24  0908      History   Chief Complaint Chief Complaint  Patient presents with   Sore Throat    HPI Isaiah Perez is a 44 y.o. male.  Patient presents with sensation of throat swelling during the night and when he woke up this morning.  He reports some postnasal drainage and he felt like he could not clear his throat.  He states his throat does not hurt.  No fever, shortness of breath, chest pain.  No OTC medications taken.  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  His medical history includes goiter, GERD, anxiety, panic disorder.  The history is provided by the patient and medical records.    Past Medical History:  Diagnosis Date   GERD (gastroesophageal reflux disease)     Patient Active Problem List   Diagnosis Date Noted   Panic disorder 03/25/2022   Hx of trauma 03/25/2022   Blood pressure elevated without history of HTN 02/10/2019   Generalized anxiety disorder 02/10/2019   Goiter 02/28/2016    History reviewed. No pertinent surgical history.     Home Medications    Prior to Admission medications  Medication Sig Start Date End Date Taking? Authorizing Provider  cetirizine  (ZYRTEC  ALLERGY) 10 MG tablet Take 1 tablet (10 mg total) by mouth daily. 03/13/24 03/27/24 Yes Corlis Burnard DEL, NP  doxycycline (VIBRA-TABS) 100 MG tablet Take 100 mg by mouth 2 (two) times daily. 01/13/24  Yes [provider]  fluticasone (FLONASE) 50 MCG/ACT nasal spray 2 sprays. 02/04/23  Yes [provider]  lidocaine -EPINEPHrine (XYLOCAINE  W/EPI) 1 %-1:100000 injection 1 mL by Infiltration route. 01/13/24  Yes [provider]  predniSONE  (STERAPRED UNI-PAK 21 TAB) 10 MG (21) TBPK tablet Take by mouth daily. As directed 03/13/24  Yes Corlis Burnard DEL, NP  acetaminophen (TYLENOL) 500 MG tablet Take 1,000 mg by mouth.    [provider]   Landy Tea 150 MG CAPS Take 1 capsule by mouth daily as needed.    [provider]  omeprazole (PRILOSEC) 20 MG capsule Take 20 mg by mouth daily.    [provider]    Family History Family History  Problem Relation Age of Onset   Healthy Mother    Healthy Father    Prostate cancer Neg Hx    Bladder Cancer Neg Hx    Kidney cancer Neg Hx     Social History Social History[1]   Allergies   Patient has no known allergies.   Review of Systems Review of Systems  Constitutional:  Negative for chills and fever.  HENT:  Positive for postnasal drip and trouble swallowing. Negative for ear pain, sore throat and voice change.   Respiratory:  Negative for cough, shortness of breath, wheezing and stridor.   Cardiovascular:  Negative for chest pain and palpitations.  Skin:  Negative for color change and rash.     Physical Exam Triage Vital Signs ED Triage Vitals  Encounter Vitals Group     BP 03/13/24 0921 137/85     Girls Systolic BP Percentile --      Girls Diastolic BP Percentile --      Boys Systolic BP Percentile --      Boys Diastolic BP Percentile --      Pulse Rate 03/13/24 0921 72     Resp 03/13/24 0921 18  Temp 03/13/24 0921 98.1 F (36.7 C)     Temp Source 03/13/24 0921 Oral     SpO2 03/13/24 0921 94 %     Weight 03/13/24 0920 240 lb 1.3 oz (108.9 kg)     Height --      Head Circumference --      Peak Flow --      Pain Score 03/13/24 0919 7     Pain Loc --      Pain Education --      Exclude from Growth Chart --    No data found.  Updated Vital Signs BP 137/85 (BP Location: Right Arm)   Pulse 72   Temp 98.1 F (36.7 C) (Oral)   Resp 18   Wt 240 lb 1.3 oz (108.9 kg)   SpO2 96%   BMI 34.45 kg/m   Visual Acuity Right Eye Distance:   Left Eye Distance:   Bilateral Distance:    Right Eye Near:   Left Eye Near:    Bilateral Near:     Physical Exam Constitutional:      General: He is not in acute distress. HENT:     Right  Ear: Tympanic membrane normal.     Left Ear: Tympanic membrane normal.     Nose: Nose normal.     Mouth/Throat:     Mouth: Mucous membranes are moist.     Pharynx: Oropharynx is clear.     Comments: Voice clear.  No difficulty swallowing.  No oropharyngeal swelling. Cardiovascular:     Rate and Rhythm: Normal rate and regular rhythm.     Heart sounds: Normal heart sounds.  Pulmonary:     Effort: Pulmonary effort is normal. No respiratory distress.     Breath sounds: Normal breath sounds.  Neurological:     Mental Status: He is alert.      UC Treatments / Results  Labs (all labs ordered are listed, but only abnormal results are displayed) Labs Reviewed  POCT RAPID STREP A (OFFICE)    EKG   Radiology No results found.  Procedures Procedures (including critical care time)  Medications Ordered in UC Medications - No data to display  Initial Impression / Assessment and Plan / UC Course  I have reviewed the triage vital signs and the nursing notes.  Pertinent labs & imaging results that were available during my care of the patient were reviewed by me and considered in my medical decision making (see chart for details).    Sensation of swollen throat, postnasal drainage.  Afebrile and vital signs are stable.  No oropharyngeal swelling noted.  No respiratory distress.  O2 sat 96% on room air.  Patient is concerned that he may be having an allergic reaction to fish.  Treating today with Zyrtec  and prednisone .  Instructed him to follow-up with his PCP on Monday.  ED precautions given.  Education provided on food allergies.  Patient agrees to plan of care.  Final Clinical Impressions(s) / UC Diagnoses   Final diagnoses:  Sensation of swollen throat  Postnasal drip     Discharge Instructions      Take prednisone  and Zyrtec  as directed.    Follow up with your primary care provider on Monday.  Go to the emergency department if you have worsening symptoms.         ED Prescriptions     Medication Sig Dispense Auth. Provider   predniSONE  (STERAPRED UNI-PAK 21 TAB) 10 MG (21) TBPK tablet Take by mouth daily.  As directed 21 tablet Corlis Burnard DEL, NP   cetirizine  (ZYRTEC  ALLERGY) 10 MG tablet Take 1 tablet (10 mg total) by mouth daily. 14 tablet Corlis Burnard DEL, NP      PDMP not reviewed this encounter.    [1]  Social History Tobacco Use   Smoking status: Never    Passive exposure: Never   Smokeless tobacco: Never  Vaping Use   Vaping status: Never Used  Substance Use Topics   Alcohol use: Yes    Comment: social   Drug use: No     Corlis Burnard DEL, NP 03/13/24 [phone]  "

## 2024-03-13 NOTE — Discharge Instructions (Addendum)
 Take prednisone  and Zyrtec  as directed.    Follow up with your primary care provider on Monday.  Go to the emergency department if you have worsening symptoms.
